# Patient Record
Sex: Male | Born: 2000 | Race: White | Hispanic: No | Marital: Single | Smoking: Never smoker
Health system: Southern US, Community
[De-identification: ages and names within clinical notes are randomized; demographics above are authoritative.]

## PROBLEM LIST (undated history)

## (undated) DIAGNOSIS — S060X9A Concussion with loss of consciousness of unspecified duration, initial encounter: Secondary | ICD-10-CM

## (undated) DIAGNOSIS — I350 Nonrheumatic aortic (valve) stenosis: Secondary | ICD-10-CM

## (undated) DIAGNOSIS — S060XAA Concussion with loss of consciousness status unknown, initial encounter: Secondary | ICD-10-CM

## (undated) HISTORY — DX: Nonrheumatic aortic (valve) stenosis: I35.0

## (undated) HISTORY — DX: Concussion with loss of consciousness of unspecified duration, initial encounter: S06.0X9A

## (undated) HISTORY — DX: Concussion with loss of consciousness status unknown, initial encounter: S06.0XAA

---
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
## 2018-09-28 NOTE — Progress Notes (Signed)
Cardiology Office Note   Date:  09/29/2018   ID:  Ronald Grant, DOB 12/01/2000, MRN [id number]  PCP:  Donita BrooksPickard, Warren T, MD  Cardiologist:   Rollene RotundaJames Tykera Skates, MD  Referring:  Donita BrooksPickard, Warren T, MD   Chief Complaint  Patient presents with  . Bicuspid Aortic Valve      History of Present Illness: Ronald FabianDustin R Grant is a 17 y.o. male who is referred by Donita BrooksPickard, Warren T, MD for evaluation of questionable aortic valve disorder.   I see an echo from 2004 which could not exclude a bicuspid aortic valve.  He was followed by pediatric cardiology.  Last evaluation appears to be in 2014.  I believe the echo was done at that time.  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  However, there is no mention of other congenital problems.  He returns for one year follow up.    Since I last saw him he is done well.  He is now sophomore in college.  He plays basketball occasionally.  He works at [employer] and has to do a lot of lifting with this. The patient denies any new symptoms such as chest discomfort, neck or arm discomfort. There has been no new shortness of breath, PND or orthopnea. There have been no reported palpitations, presyncope or syncope    Past Medical History:  Diagnosis Date  . Aortic stenosis    followed by Dr. Ace GinsBuck  . Concussion     History reviewed. No pertinent surgical history.   No current outpatient medications on file.   No current facility-administered medications for this visit.     Allergies:   Patient has no known allergies.    ROS:  Please see the history of present illness.   Otherwise, review of systems are positive for none.   All other systems are reviewed and negative.    PHYSICAL EXAM: VS:  BP 102/62   Pulse (!) 53   Ht 5\' 7"  (1.702 m)   Wt 103 lb 9.6 oz (47 kg)   BMI 16.23 kg/m  , BMI Body mass index is 16.23 kg/m. GENERAL:  Well appearing NECK:  No jugular venous distention, waveform  within normal limits, carotid upstroke brisk and symmetric, no bruits, no thyromegaly LUNGS:  Clear to auscultation bilaterally CHEST:  Unremarkable HEART:  PMI not displaced or sustained,S1 and S2 within normal limits, no S3, no S4, no clicks, no rubs, very brief apical only systolic murmur, no diastolics murmurs ABD:  Flat, positive bowel sounds normal in frequency in pitch, no bruits, no rebound, no guarding, no midline pulsatile mass, no hepatomegaly, no splenomegaly EXT:  2 plus pulses throughout, no edema, no cyanosis no clubbing    EKG:  EKG is  ordered today. The ekg ordered today demonstrates sinus rhythm, rate 53, axis within normal limits, intervals within normal limits, no acute ST-T wave changes.   Recent Labs: No results found for requested labs within last 8760 hours.    Lipid Panel No results found for: CHOL, TRIG, HDL, CHOLHDL, VLDL, LDLCALC, LDLDIRECT    Wt Readings from Last 3 Encounters:  09/29/18 103 lb 9.6 oz (47 kg) (<1 %, Z= -3.04)*  08/09/17 109 lb (49.4 kg) (<1 %, Z= -2.37)*  02/12/17 102 lb (46.3 kg) (<1 %, Z= -2.81)*   * Growth percentiles are based on CDC (Boys, 2-20 Years) data.      Other studies Reviewed: Additional studies/ records that were  reviewed today include:  Echo images reviewed with the patient and his mom Review of the above records demonstrates:  As above   ASSESSMENT AND PLAN:   ABNORMAL ECHO:   He does have a bicuspid aortic valve with mild aortic valve calcification and minimal gradient.  I will follow this clinically.  Given this I think he does need a CTA of his aorta to make sure that there is no coarctation or associated anomaly.  Otherwise no change in therapy is indicated and he can have this followed clinically.     Current medicines are reviewed at length with the patient today.  The patient does not have concerns regarding medicines.  The following changes have been made:  None  Labs/ tests ordered today include:    Orders Placed This Encounter  Procedures  . CT ANGIO CHEST AORTA W &/OR WO CONTRAST  . EKG 12-Lead     Disposition:   FU with me in in 2 years.     Signed, Rollene Rotunda, MD  09/29/2018 12:40 PM    [facility]

## 2018-09-29 ENCOUNTER — Ambulatory Visit: Payer: BLUE CROSS/BLUE SHIELD | Admitting: Cardiology

## 2018-09-29 ENCOUNTER — Encounter: Payer: Self-pay | Admitting: Cardiology

## 2018-09-29 VITALS — BP 102/62 | HR 53 | Ht 67.0 in | Wt 103.6 lb

## 2018-09-29 DIAGNOSIS — Q2381 Bicuspid aortic valve: Secondary | ICD-10-CM | POA: Insufficient documentation

## 2018-09-29 DIAGNOSIS — Q231 Congenital insufficiency of aortic valve: Secondary | ICD-10-CM | POA: Diagnosis not present

## 2018-09-29 NOTE — Patient Instructions (Signed)
Medication Instructions:  Continue current medications  If you need a refill on your cardiac medications before your next appointment, please call your pharmacy.  Labwork: None Ordered   If you have labs (blood work) drawn today and your tests are completely normal, you will receive your results only by [employer]Chart Message (if you have MyChart) -OR- A paper copy in the mail.  If you have any lab test that is abnormal or we need to change your treatment, we will call you to review these results.  Testing/Procedures: Non-Cardiac CT scanning, (CAT scanning), is a noninvasive, special x-ray that produces cross-sectional images of the body using x-rays and a computer. CT scans help physicians diagnose and treat medical conditions. For some CT exams, a contrast material is used to enhance visibility in the area of the body being studied. CT scans provide greater clarity and reveal more details than regular x-ray exams.  Follow-Up: You will need a follow up appointment in 2 years.  Please call our office 2 months in advance to schedule this appointment.  You may see Dr Antoine PocheHochrein or one of the following Advanced Practice Providers on your designated Care Team:   Theodore DemarkRhonda Barrett, PA-C . Joni ReiningKathryn Lawrence, DNP, ANP   At [facility], you and your health needs are our priority.  As part of our continuing mission to provide you with exceptional heart care, we have created designated Provider Care Teams.  These Care Teams include your primary Cardiologist (physician) and Advanced Practice Providers (APPs -  Physician Assistants and Nurse Practitioners) who all work together to provide you with the care you need, when you need it.

## 2018-10-16 ENCOUNTER — Ambulatory Visit (INDEPENDENT_AMBULATORY_CARE_PROVIDER_SITE_OTHER)
Admission: RE | Admit: 2018-10-16 | Discharge: 2018-10-16 | Disposition: A | Discharge status: Home / Self Care | Payer: BLUE CROSS/BLUE SHIELD | Source: Ambulatory Visit | Attending: Cardiology | Admitting: Cardiology

## 2018-10-16 DIAGNOSIS — Q231 Congenital insufficiency of aortic valve: Secondary | ICD-10-CM

## 2018-10-16 MED ORDER — IOPAMIDOL (ISOVUE-370) INJECTION 76%
100.0000 mL | Freq: Once | INTRAVENOUS | Status: AC | PRN
  Administered 2018-10-16: 100 mL via INTRAVENOUS

## 2018-10-22 ENCOUNTER — Telehealth: Payer: Self-pay | Admitting: Cardiology

## 2018-10-22 NOTE — Telephone Encounter (Signed)
Two years is fine.

## 2018-10-22 NOTE — Telephone Encounter (Signed)
Left message for patients mother follow up will be in 2 years.

## 2018-10-22 NOTE — Telephone Encounter (Signed)
° ° °  Please call patient's mother at [phone] with results

## 2018-10-22 NOTE — Telephone Encounter (Signed)
Spoke with pt mother, aware of echo results. The AVS says follow up in 2 years and the echo say 1 year. Will forward to dr hochrein to confirm follow up for the mother.

## 2019-01-16 IMAGING — CT CT ANGIO CHEST
2 of 7 series · 18 of 46 positions shown · IV contrast (iopamidol)
Comparison: None.

CLINICAL DATA: 17-year-old with a bicuspid aortic valve and mild
aortic stenosis on recent echocardiography. Evaluate for possible
associated aortic coarctation or other aortic anomaly.

EXAM:
CT ANGIOGRAPHY CHEST WITH CONTRAST
TECHNIQUE: Multidetector CT imaging of the chest was performed using the
standard protocol during bolus administration of intravenous
contrast. Multiplanar CT image reconstructions and MIPs were
obtained to evaluate the vascular anatomy.
CONTRAST:  100mL [id number] IOPAMIDOL INJECTION 76% IV.

[Series 4: aorta 3.0 i31f 2 · axial · 0.59mm/px · z∈[+1122,+1418]mm · 15 of 107 slices shown]
[im 4/107  lung]
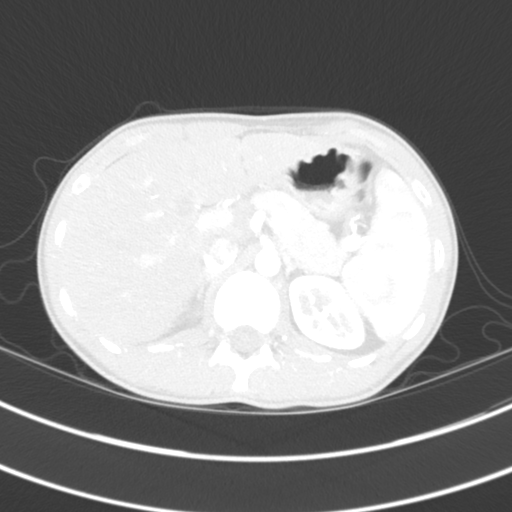
[im 11/107  soft-tissue]
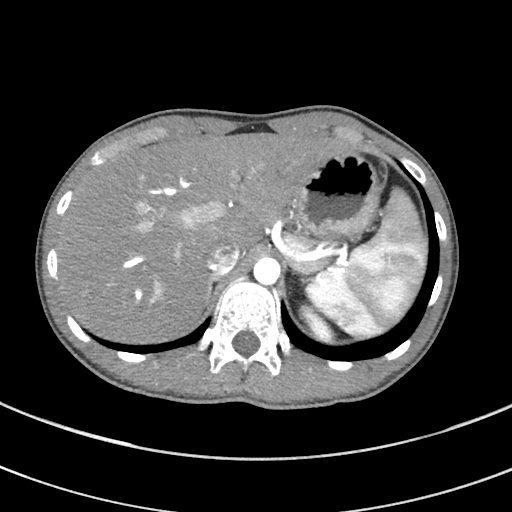
[im 19/107  lung]
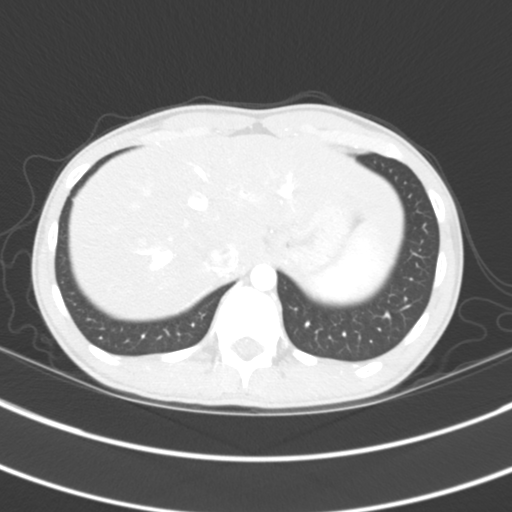
[im 26/107  soft-tissue]
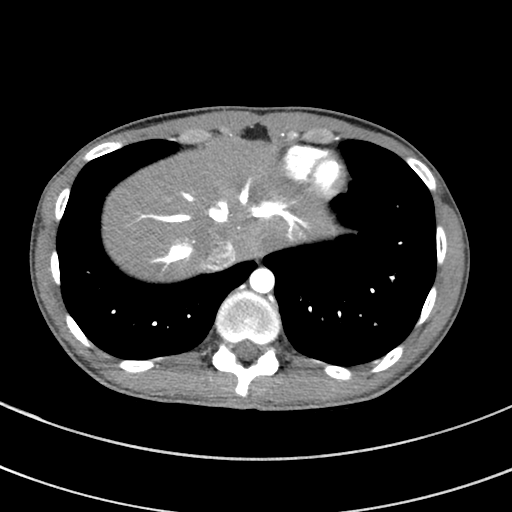
[im 33/107  lung]
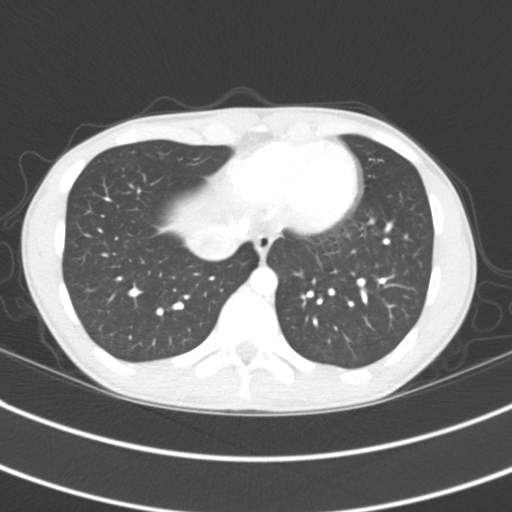
[im 41/107  soft-tissue]
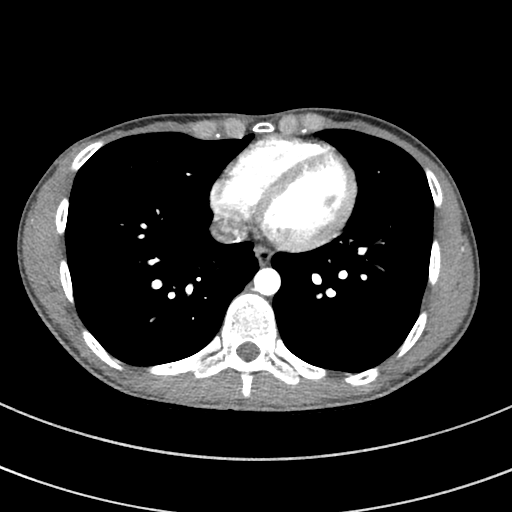
[im 48/107  lung]
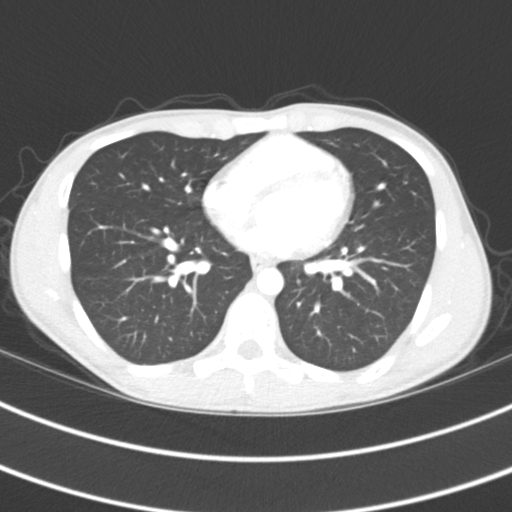
[im 55/107  soft-tissue]
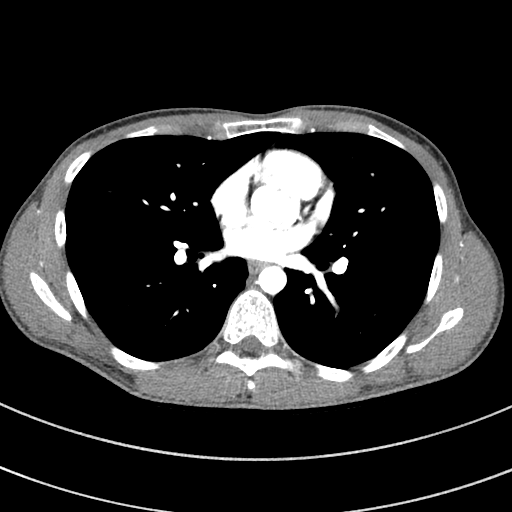
[im 59/107  lung]
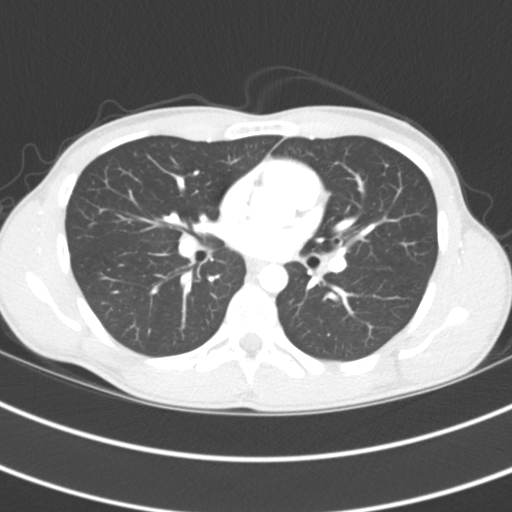
[im 66/107  soft-tissue]
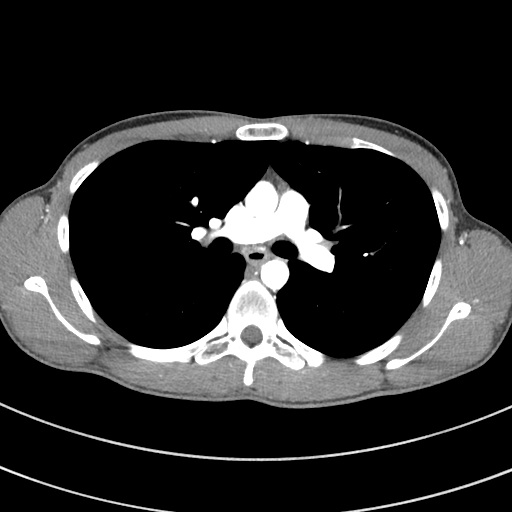
[im 74/107  lung]
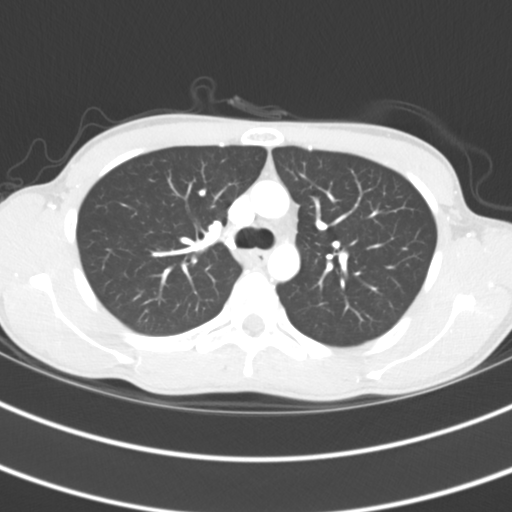
[im 81/107  soft-tissue]
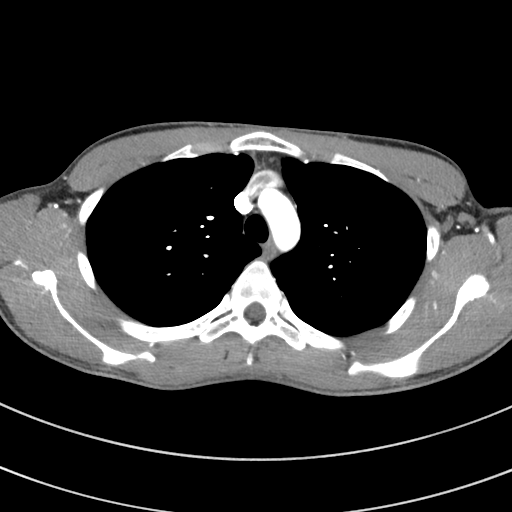
[im 88/107  lung]
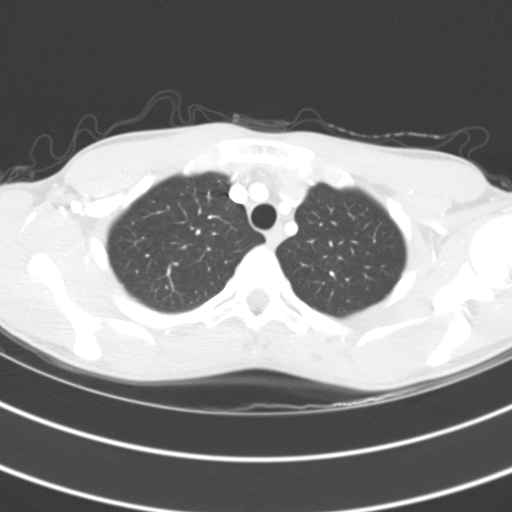
[im 96/107  soft-tissue]
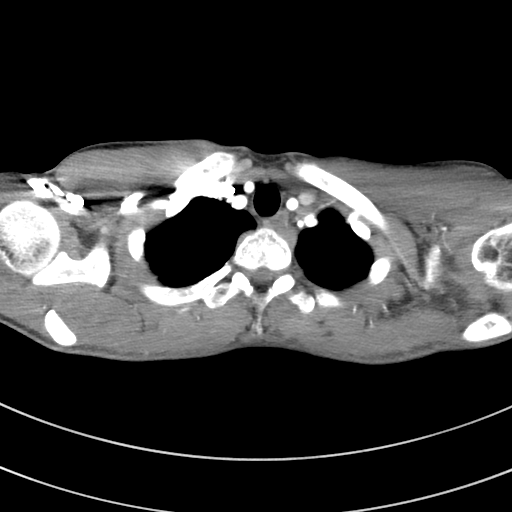
[im 103/107  lung]
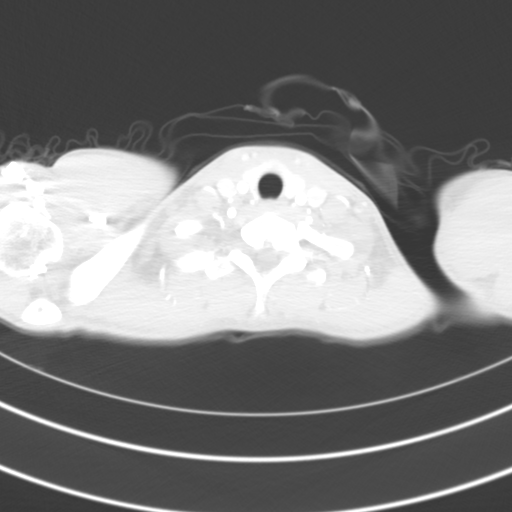

[Series 7: coronals · coronal · 0.49mm/px · 3 of 86 slices shown]
[im 22/86  soft-tissue]
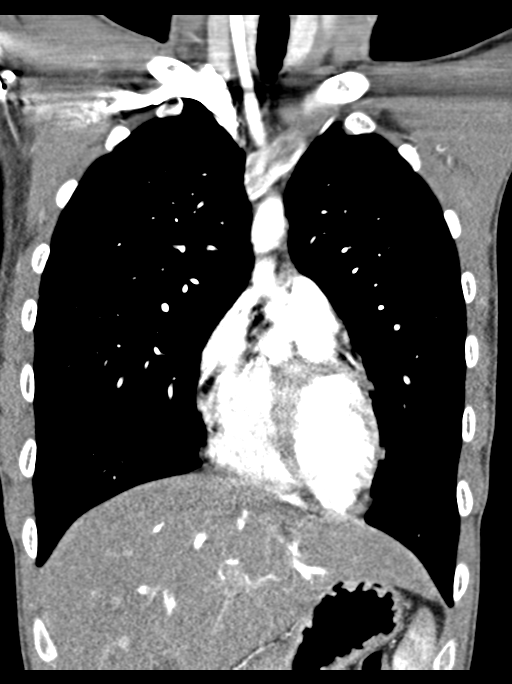
[im 43/86  soft-tissue]
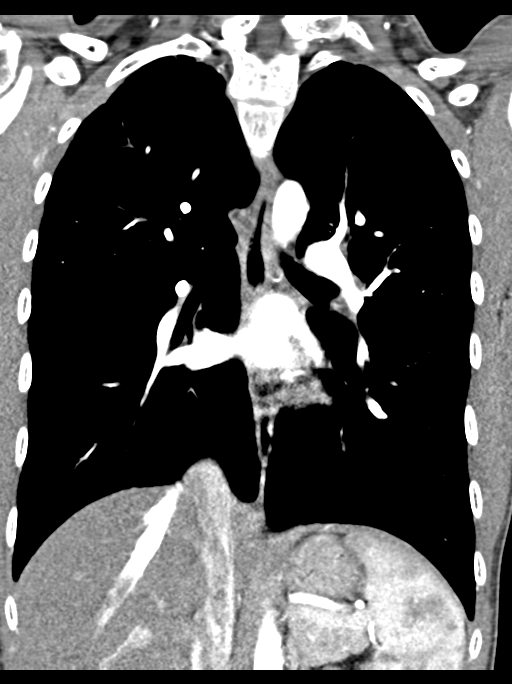
[im 64/86  soft-tissue]
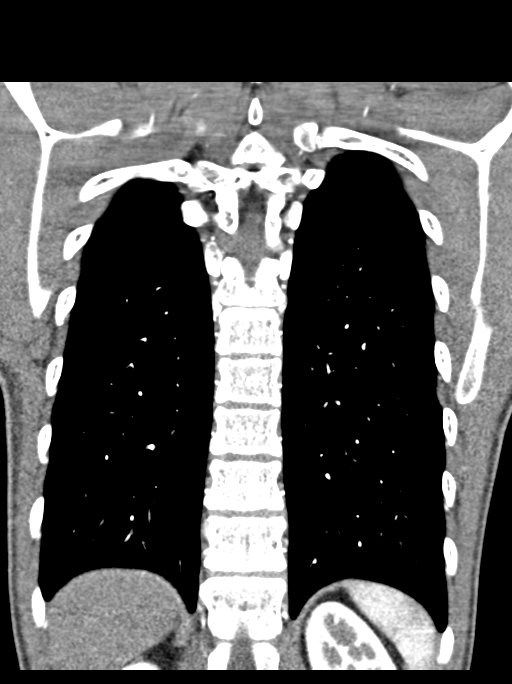

[18 of 46 positions shown; findings below may reference images not displayed]

FINDINGS: Cardiovascular: Hyperdynamic cardiac motion which makes measuring
the aortic root difficult, though the maximum measurement by CTA is
3.0 cm, within normal limits. (The measurements obtained at
echocardiography may be more accurate). The thickened aortic valve
leaflets are visible, but I do not identify valvular calcification.

Normal heart size. Borderline LEFT ventricular dilation (again best
evaluated at echocardiography). No coronary atherosclerosis. No
pericardial effusion. Central pulmonary arteries widely patent.

Mediastinum/Nodes: No pathologically enlarged mediastinal, hilar or
axillary lymph nodes. No mediastinal masses. Normal-appearing
esophagus. Normal-appearing thyroid gland.

Lungs/Pleura: Lung parenchyma clear. No airspace consolidation. No
evidence of interstitial lung disease. No parenchymal nodules. No
pleural effusions. Central airways patent without significant
bronchial wall thickening.

Upper Abdomen: Unremarkable for the early arterial phase of
enhancement which accounts for the heterogeneous enhancement of the
spleen.

Musculoskeletal: Regional skeleton unremarkable without acute or
significant osseous abnormality.

Review of the MIP images confirms the above findings.
IMPRESSION: 1. No evidence of aortic coarctation or other aortic anomaly.
2. Normal examination apart from the known bicuspid aortic valve.

## 2019-07-21 ENCOUNTER — Other Ambulatory Visit: Payer: Self-pay

## 2019-07-21 DIAGNOSIS — Z20822 Contact with and (suspected) exposure to covid-19: Secondary | ICD-10-CM

## 2019-07-23 LAB — NOVEL CORONAVIRUS, NAA: SARS-CoV-2, NAA: NOT DETECTED

## 2020-10-15 NOTE — Progress Notes (Signed)
Cardiology Office Note   Date:  10/17/2020   ID:  Ronald Grant, DOB 2000/11/10, MRN [id number]  PCP:  Donita Brooks, MD  Cardiologist:   Rollene Rotunda, MD  Referring:  Donita Brooks, MD   CC:  Bicuspid aortic valve   History of Present Illness: Ronald Grant is a 20 y.o. male who is referred by Donita Brooks, MD for evaluation of questionable aortic valve disorder.   I see an echo from 2004 which could not exclude a bicuspid aortic valve.  He was followed by pediatric cardiology.  Last evaluation appears to be in 2014.  I believe the echo was done at that time.  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  However, there is no mention of other congenital problems.  His last echo in 2018 demonstrated an EF of 60 % with functionally bicuspid valve with AV thickening.   After the last appt he had a CT which demonstrated no other congenital abnormalities.    Since I last saw him he has done well.  The patient denies any new symptoms such as chest discomfort, neck or arm discomfort. There has been no new shortness of breath, PND or orthopnea. There have been no reported palpitations, presyncope or syncope.    Past Medical History:  Diagnosis Date  . Aortic stenosis    followed by Dr. Ace Gins  . Concussion     History reviewed. No pertinent surgical history.   No current outpatient medications on file.   No current facility-administered medications for this visit.    Allergies:   Patient has no known allergies.    ROS:  Please see the history of present illness.   Otherwise, review of systems are positive for none.   All other systems are reviewed and negative.    PHYSICAL EXAM: VS:  BP 114/70   Pulse (!) 55   Ht 5\' 7"  (1.702 m)   Wt 109 lb 6.4 oz (49.6 kg)   BMI 17.13 kg/m  , BMI Body mass index is 17.13 kg/m.  GENERAL:  Well appearing NECK:  No jugular venous distention, waveform within normal limits, carotid  upstroke brisk and symmetric, no bruits, no thyromegaly LUNGS:  Clear to auscultation bilaterally CHEST:  Unremarkable HEART:  PMI not displaced or sustained,S1 and S2 within normal limits, no S3, no S4, no clicks, no rubs, very brief soft apical systolic murmur, no diastolic murmurs ABD:  Flat, positive bowel sounds normal in frequency in pitch, no bruits, no rebound, no guarding, no midline pulsatile mass, no hepatomegaly, no splenomegaly EXT:  2 plus pulses throughout, no edema, no cyanosis no clubbing    GENERAL:  Well appearing NECK:  No jugular venous distention, waveform within normal limits, carotid upstroke brisk and symmetric, no bruits, no thyromegaly LUNGS:  Clear to auscultation bilaterally CHEST:  Unremarkable HEART:  PMI not displaced or sustained,S1 and S2 within normal limits, no S3, no S4, no clicks, no rubs, very brief apical only systolic murmur, no diastolics murmurs ABD:  Flat, positive bowel sounds normal in frequency in pitch, no bruits, no rebound, no guarding, no midline pulsatile mass, no hepatomegaly, no splenomegaly EXT:  2 plus pulses throughout, no edema, no cyanosis no clubbing    EKG:  EKG is  ordered today. The ekg ordered today demonstrates sinus rhythm, rate 55, axis within normal limits, intervals within normal limits, no acute ST-T wave changes.   Recent Labs: No  results found for requested labs within last 8760 hours.    Lipid Panel No results found for: CHOL, TRIG, HDL, CHOLHDL, VLDL, LDLCALC, LDLDIRECT    Wt Readings from Last 3 Encounters:  10/17/20 109 lb 6.4 oz (49.6 kg)  09/29/18 103 lb 9.6 oz (47 kg) (<1 %, Z= -3.04)*  08/09/17 109 lb (49.4 kg) (<1 %, Z= -2.37)*   * Growth percentiles are based on CDC (Boys, 2-20 Years) data.      Other studies Reviewed: Additional studies/ records that were reviewed today include:  CT Review of the above records demonstrates:  As above   ASSESSMENT AND PLAN:   ABNORMAL ECHO:    He does  have a functionally bicuspid aortic valve with a small noncoronary cusp.  He has no other abnormalities on CT noted.  He has no symptoms.  I will follow this with an echocardiogram next year as it would have been 5 years.  Otherwise no change in therapy.   Current medicines are reviewed at length with the patient today.  The patient does not have concerns regarding medicines.  The following changes have been made:  none  Labs/ tests ordered today include:   Orders Placed This Encounter  Procedures  . EKG 12-Lead  . ECHOCARDIOGRAM COMPLETE     Disposition:   FU with me in in one year.     Signed, Rollene Rotunda, MD  10/17/2020 12:56 PM    [facility]

## 2020-10-17 ENCOUNTER — Other Ambulatory Visit: Payer: Self-pay

## 2020-10-17 ENCOUNTER — Ambulatory Visit (INDEPENDENT_AMBULATORY_CARE_PROVIDER_SITE_OTHER): Payer: BC Managed Care – PPO | Admitting: Cardiology

## 2020-10-17 ENCOUNTER — Encounter: Payer: Self-pay | Admitting: Cardiology

## 2020-10-17 ENCOUNTER — Telehealth: Payer: Self-pay | Admitting: Cardiology

## 2020-10-17 VITALS — BP 114/70 | HR 55 | Ht 67.0 in | Wt 109.4 lb

## 2020-10-17 DIAGNOSIS — I350 Nonrheumatic aortic (valve) stenosis: Secondary | ICD-10-CM

## 2020-10-17 NOTE — Telephone Encounter (Signed)
Spoke with patient to schedule the Echo ordered by Dr. Antoine Poche for January 2023----scheduled 10/17/21 at 10:15 am at [address].  Will mail information to patient and he voiced his understanding.

## 2020-10-17 NOTE — Patient Instructions (Addendum)
Medication Instructions:  No changes *If you need a refill on your cardiac medications before your next appointment, please call your pharmacy*  Lab Work: None ordered this visit.  Testing/Procedures: Your physician has requested that you have an echocardiogram in January 2023. Echocardiography is a painless test that uses sound waves to create images of your heart. It provides your doctor with information about the size and shape of your heart and how well your heart's chambers and valves are working. This procedure takes approximately one hour. There are no restrictions for this procedure.  Follow-Up: At [facility], you and your health needs are our priority.  As part of our continuing mission to provide you with exceptional heart care, we have created designated Provider Care Teams.  These Care Teams include your primary Cardiologist (physician) and Advanced Practice Providers (APPs -  Physician Assistants and Nurse Practitioners) who all work together to provide you with the care you need, when you need it.  Your next appointment:   12 month(s) after Echo  The format for your next appointment:   In Person  Provider:   Rollene Rotunda, MD

## 2021-04-12 ENCOUNTER — Other Ambulatory Visit: Payer: Self-pay

## 2021-04-12 ENCOUNTER — Encounter: Payer: Self-pay | Admitting: Nurse Practitioner

## 2021-04-12 ENCOUNTER — Ambulatory Visit (INDEPENDENT_AMBULATORY_CARE_PROVIDER_SITE_OTHER): Payer: BC Managed Care – PPO | Admitting: Nurse Practitioner

## 2021-04-12 VITALS — BP 114/68 | HR 69 | Temp 98.6°F | Ht 67.0 in | Wt 108.0 lb

## 2021-04-12 DIAGNOSIS — R21 Rash and other nonspecific skin eruption: Secondary | ICD-10-CM | POA: Diagnosis not present

## 2021-04-12 MED ORDER — KETOCONAZOLE 2 % EX CREA: 1.0000 "application " | TOPICAL_CREAM | Freq: Every day | CUTANEOUS | 0 refills | Status: DC

## 2021-04-12 NOTE — Progress Notes (Signed)
Subjective:    Patient ID: Ronald Grant, male    DOB: August 22, 2001, 20 y.o.   MRN: [id number]  HPI: Ronald Grant is a 20 y.o. male presenting for rash.  Chief Complaint  Patient presents with   Rash    Spot on right arm, has been present for months, used lotromin ring worm, began to clear some from the middle of the rash leaving border of rash, does itch some, no spreading of the rash that he notices   RASH Duration:  months  Location: right arm  Itching: yes Burning: no Redness: yes Oozing: no Scaling: yes Blisters: no Painful: no Fevers: no Change in detergents/soaps/personal care products: no Recent illness: no Recent travel:no History of same: no Context: better Alleviating factors: lotromin Treatments attempted: lotromin Shortness of breath: no  Throat/tongue swelling: no Myalgias/arthralgias: no  No Known Allergies  Outpatient Encounter Medications as of 04/12/2021  Medication Sig   ketoconazole (NIZORAL) 2 % cream Apply 1 application topically daily.   No facility-administered encounter medications on file as of 04/12/2021.    Patient Active Problem List   Diagnosis Date Noted   Bicuspid aortic valve 09/29/2018   URI, acute 09/03/2013   Aortic stenosis     Past Medical History:  Diagnosis Date   Aortic stenosis    followed by Dr. Ace Gins   Concussion     Relevant past medical, surgical, family and social history reviewed and updated as indicated. Interim medical history since our last visit reviewed.  Review of Systems Per HPI unless specifically indicated above     Objective:    BP 114/68   Pulse 69   Temp 98.6 F (37 C)   Ht 5\' 7"  (1.702 m)   Wt 108 lb (49 kg)   SpO2 100%   BMI 16.92 kg/m   Wt Readings from Last 3 Encounters:  04/12/21 108 lb (49 kg)  10/17/20 109 lb 6.4 oz (49.6 kg)  09/29/18 103 lb 9.6 oz (47 kg) (<1 %, Z= -3.04)*   * Growth percentiles are based on CDC (Boys, 2-20 Years) data.    Physical Exam Vitals  and nursing note reviewed.  Constitutional:      General: He is not in acute distress.    Appearance: Normal appearance. He is not toxic-appearing.  Eyes:     Extraocular Movements: Extraocular movements intact.  Cardiovascular:     Rate and Rhythm: Normal rate.  Skin:    General: Skin is warm and dry.     Capillary Refill: Capillary refill takes less than 2 seconds.     Findings: Rash present. Rash is crusting and purpuric. Rash is not pustular, scaling or vesicular.          Comments: 3 slightly erythematous annular lesions noted to right posterior forearm in area marked below.  Skin is flaky and center appears slightly shiny.  Neurological:     Mental Status: He is alert and oriented to person, place, and time.     Motor: No weakness.     Gait: Gait normal.  Psychiatric:        Mood and Affect: Mood normal.        Behavior: Behavior normal.        Thought Content: Thought content normal.        Judgment: Judgment normal.      Assessment & Plan:  1. Rash History and examination consistent with fungal rash; tinea corporis.  No changes in soaps/detergents, no new medications,  no shortness of breath or throat/tongue swelling today.  Will treat with ketoconazole x 2 weeks.  With no improvement, return to clinic.   - ketoconazole (NIZORAL) 2 % cream; Apply 1 application topically daily.  Dispense: 15 g; Refill: 0     Follow up plan: Return if symptoms worsen or fail to improve.

## 2021-06-01 ENCOUNTER — Other Ambulatory Visit: Payer: Self-pay

## 2021-06-01 ENCOUNTER — Encounter: Payer: Self-pay | Admitting: Family Medicine

## 2021-06-01 ENCOUNTER — Ambulatory Visit: Payer: BC Managed Care – PPO | Admitting: Family Medicine

## 2021-06-01 VITALS — BP 112/64 | HR 80 | Temp 97.9°F | Resp 16 | Ht 67.0 in | Wt 108.0 lb

## 2021-06-01 DIAGNOSIS — L209 Atopic dermatitis, unspecified: Secondary | ICD-10-CM

## 2021-06-01 MED ORDER — MOMETASONE FUROATE 0.1 % EX CREA: TOPICAL_CREAM | Freq: Every day | CUTANEOUS | 1 refills | Status: DC

## 2021-06-01 NOTE — Progress Notes (Signed)
   Subjective:    Patient ID: Ronald Grant, male    DOB: Feb 25, 2001, 20 y.o.   MRN: [id number]  HPI  Patient has had a rash on his dorsal right forearm ever since early June.  The rash is itchy.  He was treated for ringworm.  He treated it with Lotrimin which saw some minimal improvement.  He then came in and saw my partner who treated with ketoconazole.  Today on examination there is a patch of skin roughly 4 to 5 cm in diameter.  The patch of skin has numerous small skin colored papules 1 to 2 mm in size.  There are clusters of papules and there is a papulosquamous eruption and that patch of skin.  There is also some mild hypopigmentation due to surrounding tanning.  There is no raised rolled edge.  There is no scale.  I scraped the rash thoroughly with a slide and performed a KOH which revealed no branched chain hyphae. Past Medical History:  Diagnosis Date   Aortic stenosis    followed by Dr. Ace Gins   Concussion    History reviewed. No pertinent surgical history. Current Outpatient Medications on File Prior to Visit  Medication Sig Dispense Refill   ketoconazole (NIZORAL) 2 % cream Apply 1 application topically daily. (Patient not taking: Reported on 06/01/2021) 15 g 0   No current facility-administered medications on file prior to visit.   No Known Allergies Social History   Socioeconomic History   Marital status: Single    Spouse name: Not on file   Number of children: Not on file   Years of education: Not on file   Highest education level: Not on file  Occupational History   Not on file  Tobacco Use   Smoking status: Never   Smokeless tobacco: Never  Substance and Sexual Activity   Alcohol use: No   Drug use: No   Sexual activity: Never  Other Topics Concern   Not on file  Social History Narrative   Not on file   Social Determinants of Health   Financial Resource Strain: Not on file  Food Insecurity: Not on file  Transportation Needs: Not on file  Physical  Activity: Not on file  Stress: Not on file  Social Connections: Not on file  Intimate Partner Violence: Not on file     Review of Systems  All other systems reviewed and are negative.     Objective:   Physical Exam Vitals reviewed.  Cardiovascular:     Rate and Rhythm: Normal rate and regular rhythm.     Heart sounds: Normal heart sounds.  Pulmonary:     Effort: Pulmonary effort is normal.     Breath sounds: Normal breath sounds.  Skin:    Findings: Erythema and rash present. Rash is papular. Rash is not scaling, urticarial or vesicular.               Assessment & Plan:  Atopic dermatitis, unspecified type I believe this is atopic dermatitis.  Apply Elocon cream once daily for 7 to 10 days and see if rash improves.  I do not feel that this is fungal given the negative KOH and the lack of response to more than a weeks of topical antifungal cream I believe the antifungal cream is helping simply as a moisturizer

## 2021-10-17 ENCOUNTER — Other Ambulatory Visit: Payer: Self-pay

## 2021-10-17 ENCOUNTER — Ambulatory Visit (HOSPITAL_COMMUNITY): Payer: BC Managed Care – PPO | Attending: Cardiovascular Disease

## 2021-10-17 DIAGNOSIS — I350 Nonrheumatic aortic (valve) stenosis: Secondary | ICD-10-CM | POA: Diagnosis present

## 2021-10-17 LAB — ECHOCARDIOGRAM COMPLETE
AR max vel: 1.47 cm2
AV Area VTI: 1.5 cm2
AV Mean grad: 6 mmHg
AV Peak grad: 11.6 mmHg
Ao pk vel: 1.7 m/s
Area-P 1/2: 3.45 cm2
S' Lateral: 2.4 cm

## 2021-10-18 NOTE — Progress Notes (Signed)
Cardiology Office Note   Date:  10/19/2021   ID:  Ronald Grant, DOB 2001/01/23, MRN [id number]  PCP:  Donita Brooks, MD  Cardiologist:   Rollene Rotunda, MD  Referring:  Donita Brooks, MD   CC:  Bicuspid aortic valve   History of Present Illness: Ronald Grant is a 21 y.o. male who is referred by Donita Brooks, MD for evaluation of questionable aortic valve disorder.   I see an echo from 2004 which could not exclude a bicuspid aortic valve.  He was followed by pediatric cardiology.  Last evaluation appears to be in 2014.  I believe the echo was done at that time.  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  However, there is no mention of other congenital problems.  His last echo in 2018 demonstrated an EF of 60 % with functionally bicuspid valve with AV thickening.  He had a CT which demonstrated no other congenital abnormalities.      He returns for follow-up of an echo which he had done yesterday.  His EF was 65 to 70%.  The aortic valve was bicuspid.  He has done well since I seen him.  He is physically active.  He plays ball.  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    Past Medical History:  Diagnosis Date   Aortic stenosis    followed by Dr. Ace Gins   Concussion     History reviewed. No pertinent surgical history.   Current Outpatient Medications  Medication Sig Dispense Refill   mometasone (ELOCON) 0.1 % cream Apply topically daily. 15 g 1   No current facility-administered medications for this visit.    Allergies:   Patient has no known allergies.    ROS:  Please see the history of present illness.   Otherwise, review of systems are positive for none.   All other systems are reviewed and negative.    PHYSICAL EXAM: VS:  BP 106/62    Ht 5\' 7"  (1.702 m)    Wt 112 lb 9.6 oz (51.1 kg)    SpO2 100%    BMI 17.64 kg/m  , BMI Body mass index is 17.64 kg/m.  GENERAL:  Well appearing NECK:  No jugular venous distention,  waveform within normal limits, carotid upstroke brisk and symmetric, no bruits, no thyromegaly LUNGS:  Clear to auscultation bilaterally CHEST:  Unremarkable HEART:  PMI not displaced or sustained,S1 and S2 within normal limits, no S3, no S4, no clicks, no rubs, very brief soft apical systolic murmur, no diastolic murmurs ABD:  Flat, positive bowel sounds normal in frequency in pitch, no bruits, no rebound, no guarding, no midline pulsatile mass, no hepatomegaly, no splenomegaly EXT:  2 plus pulses throughout, no edema, no cyanosis no clubbing    GENERAL:  Well appearing NECK:  No jugular venous distention, waveform within normal limits, carotid upstroke brisk and symmetric, no bruits, no thyromegaly LUNGS:  Clear to auscultation bilaterally CHEST:  Unremarkable HEART:  PMI not displaced or sustained,S1 and S2 within normal limits, no S3, no S4, no clicks, no rubs, very brief apical only systolic murmur, no diastolics murmurs ABD:  Flat, positive bowel sounds normal in frequency in pitch, no bruits, no rebound, no guarding, no midline pulsatile mass, no hepatomegaly, no splenomegaly EXT:  2 plus pulses throughout, no edema, no cyanosis no clubbing    EKG:  EKG is   ordered today. The ekg ordered today demonstrates sinus rhythm,  with sinus arrhythmia, rate 61, axis within normal limits, intervals within normal limits, no acute ST-T wave changes.   Recent Labs: No results found for requested labs within last 8760 hours.    Lipid Panel No results found for: CHOL, TRIG, HDL, CHOLHDL, VLDL, LDLCALC, LDLDIRECT    Wt Readings from Last 3 Encounters:  10/19/21 112 lb 9.6 oz (51.1 kg)  06/01/21 108 lb (49 kg)  04/12/21 108 lb (49 kg)      Other studies Reviewed: Additional studies/ records that were reviewed today include:  CT Review of the above records demonstrates:  As above   ASSESSMENT AND PLAN:   Bicuspid aortic valve:   He had an unremarkable echo except for evidence of a  bicuspid valve.  There was no stenosis.  There are no associated abnormalities.  No change in therapy or further imaging is indicated.  I will see him in a couple of year.   Current medicines are reviewed at length with the patient today.  The patient does not have concerns regarding medicines.  The following changes have been made:  None  Labs/ tests ordered today include: None  Orders Placed This Encounter  Procedures   EKG 12-Lead     Disposition:   FU with me  in two years.     Signed, Rollene Rotunda, MD  10/19/2021 9:41 AM    [facility]

## 2021-10-19 ENCOUNTER — Encounter: Payer: Self-pay | Admitting: Cardiology

## 2021-10-19 ENCOUNTER — Other Ambulatory Visit: Payer: Self-pay

## 2021-10-19 ENCOUNTER — Ambulatory Visit: Payer: BC Managed Care – PPO | Admitting: Cardiology

## 2021-10-19 VITALS — BP 106/62 | Ht 67.0 in | Wt 112.6 lb

## 2021-10-19 DIAGNOSIS — Q231 Congenital insufficiency of aortic valve: Secondary | ICD-10-CM

## 2021-10-19 NOTE — Patient Instructions (Signed)
Medication Instructions:  Your Physician recommend you continue on your current medication as directed.    *If you need a refill on your cardiac medications before your next appointment, please call your pharmacy*   Follow-Up: At [facility], you and your health needs are our priority.  As part of our continuing mission to provide you with exceptional heart care, we have created designated Provider Care Teams.  These Care Teams include your primary Cardiologist (physician) and Advanced Practice Providers (APPs -  Physician Assistants and Nurse Practitioners) who all work together to provide you with the care you need, when you need it.  We recommend signing up for the patient portal called "MyChart".  Sign up information is provided on this After Visit Summary.  MyChart is used to connect with patients for Virtual Visits (Telemedicine).  Patients are able to view lab/test results, encounter notes, upcoming appointments, etc.  Non-urgent messages can be sent to your provider as well.   To learn more about what you can do with MyChart, go to [email or web address].    Your next appointment:   2 year(s)  The format for your next appointment:   In Person  Provider:   Dr. Vita Barley, MD

## 2022-08-24 ENCOUNTER — Ambulatory Visit: Payer: Self-pay

## 2022-08-24 NOTE — Telephone Encounter (Signed)
  Chief Complaint: Hiccups Symptoms: Hiccups Frequency: weds night Pertinent Negatives: Patient denies Chest pain difficulty breathing Disposition: [] ED /[] Urgent Care (no appt availability in office) / [] Appointment(In office/virtual)/ []  [place] [facility]/ [x] Home Care/ [] Refused Recommended Disposition /[] [place] [facility]/ []  Follow-up with PCP Additional Notes: Pt has had hiccups off and on  since weds night. Pt has had them go away using"drinking from the wrong side of the cup technique". Currently pt is not hiccupping. Pt will call back if home remedys are not effective.    Summary: hiccups   Pt called in stating that he has had hiccups for the past 2 days. Pt was a little concerned and didn't know what he could do. Please advise.  Cb#: [phone]     Reason for Disposition  Hiccups present < 24 hours  [1] Hiccups present > 3 hours AND [2] severe AND [3] no improvement using hiccup treatment per protocol  Answer Assessment - Initial Assessment Questions 1. ONSET: "When did the hiccups begin?"      Weds night 2. SEVERITY: "How bad are the hiccups now?"  (Severe: unable to eat, drink or sleep because of hiccups)     Just stopped a few minutes ago 3. TREATMENT: "What have you done so far to treat the hiccups?"     Drink form opposite side of the cup. 4. RECURRENT SYMPTOM: "Have you ever had severe hiccups before?" If Yes, ask: "When was the last time?" and "What happened that time?"      no 5. OTHER SYMPTOMS: "Do you have any other symptoms? (e.g., chest pain, difficulty breathing,  abdomen pain, vomiting)     Cold this week. 6. PREGNANCY: "Is there any chance you are pregnant?" "When was your last menstrual period?"     na  Protocols used: Hiccups-A-AH

## 2022-09-20 ENCOUNTER — Other Ambulatory Visit: Payer: Self-pay | Admitting: Family Medicine

## 2022-09-20 NOTE — Telephone Encounter (Signed)
Called patient to schedule appt for medication refills. Patient not seen in 1 year , scheduled physical annual exam. Requesting also to complete DOT exam . Scheduled patient for 10/05/22.

## 2022-09-20 NOTE — Telephone Encounter (Signed)
Requested medication (s) are due for refill today: expired medication  Requested medication (s) are on the active medication list: yes  Last refill:  06/01/21 #15 g 1 refill  Future visit scheduled: yes in 2 weeks  Notes to clinic:  expired medication. Medication not assigned to a protocol. Scheduled appt for physical/ DOT exam 10/05/22. Do you want to renew Rx? Patient reports he is almost out of medication.     Requested Prescriptions  Pending Prescriptions Disp Refills   mometasone (ELOCON) 0.1 % cream [Pharmacy Med Name: MOMETASONE FUROATE 0.1% CREAM] 15 g 1    Sig: APPLY TO AFFECTED AREA TOPICALLY EVERY DAY     Off-Protocol Failed - 09/20/2022 11:44 AM      Failed - Medication not assigned to a protocol, review manually.      Failed - Valid encounter within last 12 months    Recent Outpatient Visits           1 year ago Atopic dermatitis, unspecified type   [facility] Pickard, Priscille Heidelberg, MD   1 year ago Rash   [facility] Valentino Nose, NP   5 years ago Acute rhinosinusitis   [facility] Tanya Nones, Priscille Heidelberg, MD   6 years ago [facility] (well child check)   [facility] Pickard, Priscille Heidelberg, MD   9 years ago URI, acute   [facility] [place], Velna Hatchet, MD       Future Appointments             In 2 weeks Pickard, Priscille Heidelberg, MD [facility]

## 2022-09-26 ENCOUNTER — Other Ambulatory Visit: Payer: Self-pay | Admitting: Family Medicine

## 2022-09-27 NOTE — Telephone Encounter (Signed)
Requested medication (s) are due for refill today:   Yes  Requested medication (s) are on the active medication list:   Yes  Future visit scheduled:   Yes 10/05/2022   Last ordered: 06/01/2021 15 g, 1 refill  Returned because there isn't a protocol assigned to this medication.   Provider to review for refills prior to upcoming appt.     Requested Prescriptions  Pending Prescriptions Disp Refills   mometasone (ELOCON) 0.1 % cream [Pharmacy Med Name: MOMETASONE FUROATE 0.1% CREAM] 15 g 1    Sig: APPLY TO AFFECTED AREA TOPICALLY EVERY DAY     Off-Protocol Failed - 09/26/2022  1:25 PM      Failed - Medication not assigned to a protocol, review manually.      Failed - Valid encounter within last 12 months    Recent Outpatient Visits           1 year ago Atopic dermatitis, unspecified type   [facility] Pickard, Priscille Heidelberg, MD   1 year ago Rash   [facility] Valentino Nose, NP   5 years ago Acute rhinosinusitis   [facility] Tanya Nones, Priscille Heidelberg, MD   6 years ago [facility] (well child check)   [facility] Pickard, Priscille Heidelberg, MD   9 years ago URI, acute   [facility] [place], Velna Hatchet, MD       Future Appointments             In 1 week Pickard, Priscille Heidelberg, MD [facility]

## 2022-10-05 ENCOUNTER — Encounter: Payer: Self-pay | Admitting: Family Medicine

## 2022-10-05 ENCOUNTER — Ambulatory Visit (INDEPENDENT_AMBULATORY_CARE_PROVIDER_SITE_OTHER): Payer: BC Managed Care – PPO | Admitting: Family Medicine

## 2022-10-05 ENCOUNTER — Telehealth: Payer: Self-pay | Admitting: Family Medicine

## 2022-10-05 VITALS — BP 120/70 | HR 89 | Ht 67.0 in | Wt 111.0 lb

## 2022-10-05 DIAGNOSIS — L300 Nummular dermatitis: Secondary | ICD-10-CM | POA: Diagnosis not present

## 2022-10-05 DIAGNOSIS — R21 Rash and other nonspecific skin eruption: Secondary | ICD-10-CM | POA: Diagnosis not present

## 2022-10-05 MED ORDER — MOMETASONE FUROATE 0.1 % EX CREA: TOPICAL_CREAM | Freq: Every day | CUTANEOUS | 1 refills | Status: AC

## 2022-10-05 NOTE — Progress Notes (Signed)
Subjective:    Patient ID: Ronald Grant, male    DOB: [birth year], 21 y.o.   MRN: [id number]  Rash  Patient originally made the appointment for a DOT physical.  However I do not have sufficient to perform DOT physicals.  Therefore he will need to get this through urgent care.  However he wanted to be seen today for rash.  He has had this rash off and on for a year.  It is primarily on his arms.  The rash are point shaped plaque-like patches..  The skin is erythematous and scaly within the patch.  It itches.  However it is raised compared to the surrounding area.  There are 3 patches on his right forearm.  The largest is the size of a dime.  He has 2 patches on his left forearm.  He states that the rash is worse in the winter months and goes away in the summer.  He responds rapidly to steroid cream usually going away within a few days of taking the cream.  He denies any systemic symptoms such as chest pain shortness of breath dyspnea on exertion pleurisy or any history of systemic illness. Past Medical History:  Diagnosis Date   Aortic stenosis    followed by Dr. Ace Gins   Concussion    No past surgical history on file. No current outpatient medications on file prior to visit.   No current facility-administered medications on file prior to visit.   No Known Allergies Social History   Socioeconomic History   Marital status: Single    Spouse name: Not on file   Number of children: Not on file   Years of education: Not on file   Highest education level: Not on file  Occupational History   Not on file  Tobacco Use   Smoking status: Never   Smokeless tobacco: Never  Substance and Sexual Activity   Alcohol use: No   Drug use: No   Sexual activity: Never  Other Topics Concern   Not on file  Social History Narrative   Not on file   Social Determinants of Health   Financial Resource Strain: Not on file  Food Insecurity: Not on file  Transportation Needs: Not on file  Physical  Activity: Not on file  Stress: Not on file  Social Connections: Not on file  Intimate Partner Violence: Not on file   Family History  Problem Relation Age of Onset   Hypertension Father    Hyperlipidemia Maternal Grandmother    Hyperlipidemia Maternal Grandfather    Hypertension Maternal Grandfather    Glaucoma Maternal Grandfather    Prostate cancer Maternal Grandfather    Heart disease Paternal Grandfather    Hyperlipidemia Paternal Grandfather    Hypertension Paternal Grandfather      Review of Systems  Skin:  Positive for rash.  All other systems reviewed and are negative.      Objective:   Physical Exam Vitals reviewed.  Constitutional:      General: He is not in acute distress.    Appearance: Normal appearance. He is normal weight. He is not ill-appearing or toxic-appearing.  HENT:     Right Ear: Tympanic membrane and ear canal normal.     Left Ear: Tympanic membrane and ear canal normal.     Mouth/Throat:     Mouth: Mucous membranes are moist.     Pharynx: No oropharyngeal exudate or posterior oropharyngeal erythema.  Eyes:     Extraocular Movements: Extraocular movements intact.  Conjunctiva/sclera: Conjunctivae normal.     Pupils: Pupils are equal, round, and reactive to light.  Neck:     Vascular: No carotid bruit.  Cardiovascular:     Rate and Rhythm: Normal rate and regular rhythm.     Pulses: Normal pulses.     Heart sounds: Normal heart sounds. No murmur heard.    No friction rub.  Pulmonary:     Effort: Pulmonary effort is normal. No respiratory distress.     Breath sounds: Normal breath sounds. No wheezing, rhonchi or rales.  Abdominal:     General: Abdomen is flat. Bowel sounds are normal. There is no distension.     Palpations: Abdomen is soft.     Tenderness: There is no abdominal tenderness. There is no guarding.  Musculoskeletal:     Cervical back: Neck supple.     Right lower leg: No edema.     Left lower leg: No edema.   Lymphadenopathy:     Cervical: No cervical adenopathy.  Skin:    Findings: Rash present.  Neurological:     General: No focal deficit present.     Mental Status: He is alert and oriented to person, place, and time. Mental status is at baseline.     Cranial Nerves: No cranial nerve deficit.     Sensory: No sensory deficit.     Motor: No weakness.     Gait: Gait normal.  Psychiatric:        Mood and Affect: Mood normal.        Behavior: Behavior normal.        Thought Content: Thought content normal.        Judgment: Judgment normal.          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  I would like to check a CBC, CMP, sed rate, and an ANA to evaluate for any evidence of systemic disease.  As long as the lab work is normal I am fine with the patient treating the rash with topical mometasone cream.

## 2022-10-05 NOTE — Telephone Encounter (Signed)
As per Dr. Tanya Nones, the copay for today's visit doesn't need to be collected since Dr. Tanya Nones wasn't able to give the patient what he wanted/needed.

## 2022-10-07 LAB — ANTI-NUCLEAR AB-TITER (ANA TITER): ANA Titer 1: 1:40 {titer} — ABNORMAL HIGH

## 2022-10-07 LAB — CBC WITH DIFFERENTIAL/PLATELET
Absolute Monocytes: 777 cells/uL (ref 200–950)
Basophils Absolute: 52 cells/uL (ref 0–200)
Basophils Relative: 0.7 %
Eosinophils Absolute: 192 cells/uL (ref 15–500)
Eosinophils Relative: 2.6 %
HCT: 46.4 % (ref 38.5–50.0)
Hemoglobin: 16.1 g/dL (ref 13.2–17.1)
Lymphs Abs: 1043 cells/uL (ref 850–3900)
MCH: 29.5 pg (ref 27.0–33.0)
MCHC: 34.7 g/dL (ref 32.0–36.0)
MCV: 85 fL (ref 80.0–100.0)
MPV: 10.6 fL (ref 7.5–12.5)
Monocytes Relative: 10.5 %
Neutro Abs: 5335 cells/uL (ref 1500–7800)
Neutrophils Relative %: 72.1 %
Platelets: 288 10*3/uL (ref 140–400)
RBC: 5.46 10*6/uL (ref 4.20–5.80)
RDW: 11.9 % (ref 11.0–15.0)
Total Lymphocyte: 14.1 %
WBC: 7.4 10*3/uL (ref 3.8–10.8)

## 2022-10-07 LAB — COMPLETE METABOLIC PANEL WITH GFR
AG Ratio: 1.7 (calc) (ref 1.0–2.5)
ALT: 11 U/L (ref 9–46)
AST: 14 U/L (ref 10–40)
Albumin: 5 g/dL (ref 3.6–5.1)
Alkaline phosphatase (APISO): 69 U/L (ref 36–130)
BUN: 20 mg/dL (ref 7–25)
CO2: 27 mmol/L (ref 20–32)
Calcium: 10 mg/dL (ref 8.6–10.3)
Chloride: 102 mmol/L (ref 98–110)
Creat: 1.02 mg/dL (ref 0.60–1.24)
Globulin: 2.9 g/dL (calc) (ref 1.9–3.7)
Glucose, Bld: 71 mg/dL (ref 65–99)
Potassium: 4.8 mmol/L (ref 3.5–5.3)
Sodium: 139 mmol/L (ref 135–146)
Total Bilirubin: 0.7 mg/dL (ref 0.2–1.2)
Total Protein: 7.9 g/dL (ref 6.1–8.1)
eGFR: 106 mL/min/{1.73_m2} (ref >=60)

## 2022-10-07 LAB — ANA: Anti Nuclear Antibody (ANA): POSITIVE — AB

## 2022-10-07 LAB — SEDIMENTATION RATE: Sed Rate: 2 mm/h (ref 0–15)

## 2022-10-30 NOTE — Telephone Encounter (Signed)
If Dr. Dennard Schaumann drops a charge for the visit and insurance states that he owes a copay I can't write it off.

## 2023-07-02 NOTE — Progress Notes (Unsigned)
Cardiology Office Note:   Date:  07/04/2023  ID:  [name], DOB 2001-05-27, MRN [id number] PCP: Donita Brooks, MD  [facility] Providers Cardiologist:  None {  History of Present Illness:   Ronald Grant is a 22 y.o. male who is referred by Donita Brooks, MD for evaluation of questionable aortic valve disorder.   I see an echo from 2004 which could not exclude a bicuspid aortic valve.  He was followed by pediatric cardiology.  Last evaluation appears to be in 2014.  I believe the echo was done at that time.  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  However, there is no mention of other congenital problems.  His last echo in 2018 demonstrated an EF of 60 % with functionally bicuspid valve with AV thickening.  He had a CT which demonstrated no other congenital abnormalities.      He had no gradient on his valve in January of last year.   He returns for follow-up and has done well.  He does report that occasionally his heart rate goes high when he is exercising exercises routinely but calms down quickly.  He does not have any presyncope or syncope.  He golfs.  He plays basketball.  He has a physical job.  ROS: As stated in the HPI and negative for all other systems.  Studies Reviewed:    EKG:   EKG Interpretation Date/Time:  Thursday July 04 2023 09:08:19 EDT Ventricular Rate:  62 PR Interval:  126 QRS Duration:  86 QT Interval:  400 QTC Calculation: 406 R Axis:   74  Text Interpretation: Normal sinus rhythm with sinus arrhythmia Normal ECG When compared with ECG of 23-Aug-2003 13:04, No significant change since last tracing Confirmed by Rollene Rotunda ([id number]) on 07/04/2023 9:28:54 AM     Risk Assessment/Calculations:              Physical Exam:   VS:  BP 108/64 (BP Location: Right Arm, Patient Position: Sitting, Cuff Size: Normal)   Ht 5\' 7"  (1.702 m)   Wt 118 lb (53.5 kg)   BMI 18.48 kg/m    Wt  Readings from Last 3 Encounters:  07/04/23 118 lb (53.5 kg)  10/05/22 111 lb (50.3 kg)  10/19/21 112 lb 9.6 oz (51.1 kg)     GEN: Well nourished, well developed in no acute distress NECK: No JVD; No carotid bruits CARDIAC: RRR, no murmurs, rubs, gallops RESPIRATORY:  Clear to auscultation without rales, wheezing or rhonchi  ABDOMEN: Soft, non-tender, non-distended EXTREMITIES:  No edema; No deformity   ASSESSMENT AND PLAN:   Bicuspid aortic valve: He has no gradient.  His physical exam is unchanged.  We talked about dental hygiene.  I will follow this clinically.  Tachycardia: He has had a normal CBC.   I will check a TSH given this and his difficulty gaining weight.  However, this is not particularly symptomatic and it usually is only with significant exertion.  No other workup would be suggested.    Follow up with me in one 18 months.   Signed, Rollene Rotunda, MD

## 2023-07-04 ENCOUNTER — Ambulatory Visit: Payer: BC Managed Care – PPO | Attending: Cardiology | Admitting: Cardiology

## 2023-07-04 ENCOUNTER — Encounter: Payer: Self-pay | Admitting: Cardiology

## 2023-07-04 VITALS — BP 108/64 | Ht 67.0 in | Wt 118.0 lb

## 2023-07-04 DIAGNOSIS — R000 Tachycardia, unspecified: Secondary | ICD-10-CM | POA: Diagnosis not present

## 2023-07-04 DIAGNOSIS — Q231 Congenital insufficiency of aortic valve: Secondary | ICD-10-CM

## 2023-07-04 NOTE — Patient Instructions (Signed)
Medication Instructions:  NO CHANGES  *If you need a refill on your cardiac medications before your next appointment, please call your pharmacy*   Lab Work: TSH today   If you have labs (blood work) drawn today and your tests are completely normal, you will receive your results only by: MyChart Message (if you have MyChart) OR A paper copy in the mail If you have any lab test that is abnormal or we need to change your treatment, we will call you to review the results.   Testing/Procedures: NONE   Follow-Up: At [facility], you and your health needs are our priority.  As part of our continuing mission to provide you with exceptional heart care, we have created designated Provider Care Teams.  These Care Teams include your primary Cardiologist (physician) and Advanced Practice Providers (APPs -  Physician Assistants and Nurse Practitioners) who all work together to provide you with the care you need, when you need it.  We recommend signing up for the patient portal called "MyChart".  Sign up information is provided on this After Visit Summary.  MyChart is used to connect with patients for Virtual Visits (Telemedicine).  Patients are able to view lab/test results, encounter notes, upcoming appointments, etc.  Non-urgent messages can be sent to your provider as well.   To learn more about what you can do with MyChart, go to [email or web address].    Your next appointment:   18 months with Dr. Antoine Poche

## 2023-07-05 LAB — TSH: TSH: 1 u[IU]/mL (ref 0.450–4.500)

## 2023-12-11 ENCOUNTER — Telehealth: Payer: Self-pay | Admitting: Cardiology

## 2023-12-11 NOTE — Telephone Encounter (Signed)
 STAT if HR is under 50 or over 120 (normal HR is 60-100 beats per minute)  What is your heart rate? 190 (last week)  Do you have a log of your heart rate readings (document readings)? No   Do you have any other symptoms? Experiencing high HR and just don't feel right

## 2023-12-12 ENCOUNTER — Encounter: Payer: Self-pay | Admitting: Family Medicine

## 2023-12-12 ENCOUNTER — Ambulatory Visit: Payer: BC Managed Care – PPO | Attending: Family Medicine

## 2023-12-12 ENCOUNTER — Ambulatory Visit: Payer: BC Managed Care – PPO | Admitting: Family Medicine

## 2023-12-12 VITALS — BP 120/88 | HR 70 | Temp 98.6°F | Ht 67.0 in | Wt 123.2 lb

## 2023-12-12 DIAGNOSIS — R000 Tachycardia, unspecified: Secondary | ICD-10-CM

## 2023-12-12 LAB — CBC WITH DIFFERENTIAL/PLATELET
Absolute Lymphocytes: 1626 {cells}/uL (ref 850–3900)
Absolute Monocytes: 710 {cells}/uL (ref 200–950)
Basophils Absolute: 43 {cells}/uL (ref 0–200)
Basophils Relative: 0.6 %
Eosinophils Absolute: 178 {cells}/uL (ref 15–500)
Eosinophils Relative: 2.5 %
HCT: 46.8 % (ref 38.5–50.0)
Hemoglobin: 16 g/dL (ref 13.2–17.1)
MCH: 29.5 pg (ref 27.0–33.0)
MCHC: 34.2 g/dL (ref 32.0–36.0)
MCV: 86.3 fL (ref 80.0–100.0)
MPV: 10.6 fL (ref 7.5–12.5)
Monocytes Relative: 10 %
Neutro Abs: 4544 {cells}/uL (ref 1500–7800)
Neutrophils Relative %: 64 %
Platelets: 281 10*3/uL (ref 140–400)
RBC: 5.42 10*6/uL (ref 4.20–5.80)
RDW: 11.8 % (ref 11.0–15.0)
Total Lymphocyte: 22.9 %
WBC: 7.1 10*3/uL (ref 3.8–10.8)

## 2023-12-12 LAB — COMPLETE METABOLIC PANEL WITH GFR
AG Ratio: 1.9 (calc) (ref 1.0–2.5)
ALT: 16 U/L (ref 9–46)
AST: 16 U/L (ref 10–40)
Albumin: 5 g/dL (ref 3.6–5.1)
Alkaline phosphatase (APISO): 64 U/L (ref 36–130)
BUN: 15 mg/dL (ref 7–25)
CO2: 27 mmol/L (ref 20–32)
Calcium: 9.8 mg/dL (ref 8.6–10.3)
Chloride: 101 mmol/L (ref 98–110)
Creat: 0.77 mg/dL (ref 0.60–1.24)
Globulin: 2.6 g/dL (ref 1.9–3.7)
Glucose, Bld: 83 mg/dL (ref 65–99)
Potassium: 4.8 mmol/L (ref 3.5–5.3)
Sodium: 137 mmol/L (ref 135–146)
Total Bilirubin: 0.7 mg/dL (ref 0.2–1.2)
Total Protein: 7.6 g/dL (ref 6.1–8.1)
eGFR: 128 mL/min/{1.73_m2} (ref >=60)

## 2023-12-12 LAB — TSH: TSH: 1.24 m[IU]/L (ref 0.40–4.50)

## 2023-12-12 NOTE — Progress Notes (Signed)
 Subjective:    Patient ID: Ronald Grant, male    DOB: 2000/11/08, 23 y.o.   MRN: [id number] Patient is a very pleasant 23 year old Caucasian gentleman with a history of a bicuspid aortic valve.  Last echocardiogram was 1 year ago that showed no evidence of aortic stenosis or valve damage.  There was no gradient across the valve.  Recently, he has noticed tachycardia.  He states that he was recently running on a treadmill and his heart rate hit 190.  Even after stopping the treadmill, the patient states that his heart rate remained elevated for quite some time being able to sleep at night.  He felt restless and jittery.  He is also had an episode where his heart rate into 160 while he was playing basketball.  The only 2 events have occurred while he has been exercising.  He questions if he may be hypervigilant.  Recently there have been several deaths in his family.  Both his maternal grandfather and his maternal grandmother have died recently.  His father's best friend died suddenly and unexpectedly at 5.  Patient is visibly upset by this today.  He admits that he may be anxious related to this causing hypervigilance.  He denies any heat or cold intolerance.  He denies any chest pain.  He denies any shortness of breath.  He denies any melena or hematochezia. Past Medical History:  Diagnosis Date   Aortic stenosis    followed by Dr. Ace Gins   Concussion    No past surgical history on file. Current Outpatient Medications on File Prior to Visit  Medication Sig Dispense Refill   mometasone (ELOCON) 0.1 % cream Apply topically daily. 45 g 1   No current facility-administered medications on file prior to visit.   No Known Allergies Social History   Socioeconomic History   Marital status: Single    Spouse name: Not on file   Number of children: Not on file   Years of education: Not on file   Highest education level: Not on file  Occupational History   Not on file  Tobacco Use   Smoking  status: Never   Smokeless tobacco: Never  Substance and Sexual Activity   Alcohol use: No   Drug use: No   Sexual activity: Never  Other Topics Concern   Not on file  Social History Narrative   Not on file   Social Drivers of Health   Financial Resource Strain: Not on file  Food Insecurity: Not on file  Transportation Needs: Not on file  Physical Activity: Not on file  Stress: Not on file  Social Connections: Not on file  Intimate Partner Violence: Not on file   Family History  Problem Relation Age of Onset   Hypertension Father    Hyperlipidemia Maternal Grandmother    Hyperlipidemia Maternal Grandfather    Hypertension Maternal Grandfather    Glaucoma Maternal Grandfather    Prostate cancer Maternal Grandfather    Heart disease Paternal Grandfather    Hyperlipidemia Paternal Grandfather    Hypertension Paternal Grandfather      Review of Systems  All other systems reviewed and are negative.      Objective:   Physical Exam Vitals reviewed.  Constitutional:      General: He is not in acute distress.    Appearance: Normal appearance. He is normal weight. He is not ill-appearing or toxic-appearing.  Neck:     Vascular: No carotid bruit.  Cardiovascular:     Rate  and Rhythm: Normal rate and regular rhythm.     Pulses: Normal pulses.     Heart sounds: Normal heart sounds. No murmur heard.    No friction rub.  Pulmonary:     Effort: Pulmonary effort is normal. No respiratory distress.     Breath sounds: Normal breath sounds. No wheezing, rhonchi or rales.  Musculoskeletal:     Cervical back: Neck supple.     Right lower leg: No edema.     Left lower leg: No edema.  Lymphadenopathy:     Cervical: No cervical adenopathy.  Neurological:     Mental Status: He is alert.          Assessment & Plan:  Tachycardia - Plan: CBC with Differential/Platelet, COMPLETE METABOLIC PANEL WITH GFR, TSH, LONG TERM MONITOR (3-14 DAYS) I feel that the patient is most  likely dealing with anxiety related to the situation and that he is being hypervigilant.  SVT is a possibility however I believe that it is unlikely only to occur with exercise.  Today the patient is in normal sinus rhythm with normal vital signs.  I recommended a ZIO monitor to evaluate for any underlying cardiac arrhythmias.  I encouraged the patient to live his life normally to see if exercises triggers any type of cardiac arrhythmia.  There is no evidence for any hypertrophic cardiomyopathy on his most recent echocardiogram.  I will check a CBC to evaluate for anemia.  I will check a TSH to evaluate for hyperthyroidism.  I will check a CMP to evaluate for any electrolyte disturbances.  If this workup is negative, I believe anxiety would be the most likely trigger.  Advised the patient to avoid any stimulants such as caffeine nicotine etc.

## 2023-12-12 NOTE — Progress Notes (Unsigned)
 EP to read.

## 2023-12-16 DIAGNOSIS — R000 Tachycardia, unspecified: Secondary | ICD-10-CM

## 2023-12-29 NOTE — Progress Notes (Unsigned)
 Cardiology Office Note:   Date:  12/31/2023  ID:  Ronald Grant, DOB [birth year], MRN [id number] PCP: Donita Brooks, MD  [facility] Providers Cardiologist:  None {  History of Present Illness:   Ronald Grant is a 23 y.o. male who is referred by Donita Brooks, MD for evaluation of questionable aortic valve disorder.   I see an echo from 2004 which could not exclude a bicuspid aortic valve.  He was followed by pediatric cardiology.  Last evaluation appears to be in 2014.  I believe the echo was done at that time.  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  However, there is no mention of other congenital problems.  His last echo in 2018 demonstrated an EF of 60 % with functionally bicuspid valve with AV thickening.  He had a CT which demonstrated no other congenital abnormalities.      He had no gradient on his valve in January of 2023.   The patient reports that he has had a lot of funerals in the past several months.  He has had stress related to this.  He called late last month with episodes of rapid heartbeat and strong heartbeat.  He cannot really describe palpitations or skipping beats but thinks that may be what it was like but also there was some rapid rate.  It would come on at rest.  He would think about it never get worse.  He was not able to bring it on.  He did feel a little lightheaded.  He did not have any syncope.  He was not having any chest pressure, neck or arm discomfort.  He saw his primary provider.  He has worn a monitor but I do not yet have these results.  He just took it off and send again.  He did have labs to include normal chemistry and TSH.  He never really had symptoms like this before.  He is very active at a physical job and he cannot bring the symptoms on.  ROS: As stated in the HPI and negative for all other systems.  Studies Reviewed:    EKG:   NA  Risk Assessment/Calculations:               Physical Exam:   VS:  BP 110/62 (BP Location: Right Arm, Patient Position: Sitting)   Pulse 61   Ht 5\' 7"  (1.702 m)   Wt 122 lb 9.6 oz (55.6 kg)   SpO2 99%   BMI 19.20 kg/m    Wt Readings from Last 3 Encounters:  12/31/23 122 lb 9.6 oz (55.6 kg)  12/12/23 123 lb 4 oz (55.9 kg)  07/04/23 118 lb (53.5 kg)     GEN: Well nourished, well developed in no acute distress NECK: No JVD; No carotid bruits CARDIAC: RRR, no murmurs, rubs, gallops RESPIRATORY:  Clear to auscultation without rales, wheezing or rhonchi  ABDOMEN: Soft, non-tender, non-distended EXTREMITIES:  No edema; No deformity   ASSESSMENT AND PLAN:   Bicuspid aortic valve:   This is clinically unchanged.  No further imaging at this time.  I will see him back in a couple of years.  I will follow this clinically.   Tachycardia: He had normal labs.  Results of the monitor are pending and I will look at this when available.  I did give him a prescription for propranolol 5 to 10 mg as needed every 8 hours palpitations.  We talked  about relaxation techniques and I demonstrated some for him.   Follow up with me in 2 years.   Signed, Rollene Rotunda, MD

## 2023-12-31 ENCOUNTER — Ambulatory Visit: Payer: BC Managed Care – PPO | Attending: Cardiology | Admitting: Cardiology

## 2023-12-31 ENCOUNTER — Encounter: Payer: Self-pay | Admitting: Cardiology

## 2023-12-31 VITALS — BP 110/62 | HR 61 | Ht 67.0 in | Wt 122.6 lb

## 2023-12-31 DIAGNOSIS — R000 Tachycardia, unspecified: Secondary | ICD-10-CM | POA: Diagnosis not present

## 2023-12-31 DIAGNOSIS — Q2381 Bicuspid aortic valve: Secondary | ICD-10-CM | POA: Diagnosis not present

## 2023-12-31 MED ORDER — PROPRANOLOL HCL 10 MG PO TABS: 10.0000 mg | ORAL_TABLET | Freq: Three times a day (TID) | ORAL | 3 refills | Status: AC | PRN

## 2023-12-31 NOTE — Patient Instructions (Signed)
 Medication Instructions:  Start propanalol 10 mg by mouth every 8 hours as needed 3x daily. New script sent. *If you need a refill on your cardiac medications before your next appointment, please call your pharmacy*   Follow-Up: At [facility], you and your health needs are our priority.  As part of our continuing mission to provide you with exceptional heart care, we have created designated Provider Care Teams.  These Care Teams include your primary Cardiologist (physician) and Advanced Practice Providers (APPs -  Physician Assistants and Nurse Practitioners) who all work together to provide you with the care you need, when you need it.  We recommend signing up for the patient portal called "MyChart".  Sign up information is provided on this After Visit Summary.  MyChart is used to connect with patients for Virtual Visits (Telemedicine).  Patients are able to view lab/test results, encounter notes, upcoming appointments, etc.  Non-urgent messages can be sent to your provider as well.   To learn more about what you can do with MyChart, go to [email or web address].    Your next appointment:   2 year(s)  Provider:   Rollene Rotunda, MD     Other Instructions
